# Patient Record
Sex: Male | Born: 1985 | Race: White | Hispanic: No | Marital: Single | Smoking: Former smoker
Health system: Southern US, Community
[De-identification: ages and names within clinical notes are randomized; demographics above are authoritative.]

## PROBLEM LIST (undated history)

## (undated) DIAGNOSIS — F1910 Other psychoactive substance abuse, uncomplicated: Secondary | ICD-10-CM

## (undated) DIAGNOSIS — E079 Disorder of thyroid, unspecified: Secondary | ICD-10-CM

## (undated) HISTORY — PX: HERNIA REPAIR: SHX51

---
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
## 2011-01-29 ENCOUNTER — Emergency Department (HOSPITAL_COMMUNITY): Payer: Self-pay

## 2011-01-29 ENCOUNTER — Emergency Department (HOSPITAL_COMMUNITY)
Admission: EM | Admit: 2011-01-29 | Discharge: 2011-01-29 | Disposition: A | Discharge status: Home / Self Care | Payer: No Typology Code available for payment source | Attending: Emergency Medicine | Admitting: Emergency Medicine

## 2011-01-29 DIAGNOSIS — R51 Headache: Secondary | ICD-10-CM | POA: Insufficient documentation

## 2011-01-29 DIAGNOSIS — M546 Pain in thoracic spine: Secondary | ICD-10-CM | POA: Insufficient documentation

## 2011-01-29 DIAGNOSIS — M542 Cervicalgia: Secondary | ICD-10-CM | POA: Insufficient documentation

## 2013-06-16 ENCOUNTER — Encounter (HOSPITAL_COMMUNITY): Payer: Self-pay | Admitting: Emergency Medicine

## 2013-06-16 ENCOUNTER — Emergency Department (HOSPITAL_COMMUNITY)
Admission: EM | Admit: 2013-06-16 | Discharge: 2013-06-16 | Disposition: A | Discharge status: Home / Self Care | Payer: Self-pay | Attending: Emergency Medicine | Admitting: Emergency Medicine

## 2013-06-16 DIAGNOSIS — R197 Diarrhea, unspecified: Secondary | ICD-10-CM | POA: Insufficient documentation

## 2013-06-16 DIAGNOSIS — F19939 Other psychoactive substance use, unspecified with withdrawal, unspecified: Secondary | ICD-10-CM | POA: Insufficient documentation

## 2013-06-16 DIAGNOSIS — Z880 Allergy status to penicillin: Secondary | ICD-10-CM | POA: Insufficient documentation

## 2013-06-16 DIAGNOSIS — R109 Unspecified abdominal pain: Secondary | ICD-10-CM | POA: Insufficient documentation

## 2013-06-16 DIAGNOSIS — F17200 Nicotine dependence, unspecified, uncomplicated: Secondary | ICD-10-CM | POA: Insufficient documentation

## 2013-06-16 DIAGNOSIS — F1110 Opioid abuse, uncomplicated: Secondary | ICD-10-CM | POA: Insufficient documentation

## 2013-06-16 LAB — RAPID URINE DRUG SCREEN, HOSP PERFORMED
Amphetamines: NOT DETECTED
Cocaine: POSITIVE — AB
Opiates: NOT DETECTED
Tetrahydrocannabinol: NOT DETECTED

## 2013-06-16 LAB — CBC
HCT: 42.8 % (ref 39.0–52.0)
Hemoglobin: 14.7 g/dL (ref 13.0–17.0)
RDW: 13.1 % (ref 11.5–15.5)
WBC: 8.6 10*3/uL (ref 4.0–10.5)

## 2013-06-16 LAB — COMPREHENSIVE METABOLIC PANEL
Albumin: 3.7 g/dL (ref 3.5–5.2)
BUN: 8 mg/dL (ref 6–23)
Chloride: 105 mEq/L (ref 96–112)
Creatinine, Ser: 0.95 mg/dL (ref 0.50–1.35)
GFR calc Af Amer: >90 mL/min (ref >90)
Glucose, Bld: 105 mg/dL — ABNORMAL HIGH (ref 70–99)
Total Bilirubin: 0.2 mg/dL — ABNORMAL LOW (ref 0.3–1.2)

## 2013-06-16 LAB — ETHANOL: Alcohol, Ethyl (B): <11 mg/dL (ref 0–11)

## 2013-06-16 NOTE — ED Notes (Signed)
Sarah at [facility] will call when labs have been reviewed by nurse.

## 2013-06-16 NOTE — ED Notes (Signed)
Spoke with Maralyn Sago at [facility] in [place]. Pt has a bed for detox after he has been medically cleared and labs/screening have been faxed to [phone].  Maralyn Sago can be reached at [phone].  The pt will have 2 hours from time of discharge to arrive at the facility.

## 2013-06-16 NOTE — ED Notes (Signed)
Pt DC per [facility], will drive self there per Maralyn Sago [facility].

## 2013-06-16 NOTE — ED Provider Notes (Signed)
CSN: [id number]     Arrival date & time 06/16/13  1205 History  This chart was scribed for Vida Roller, MD by Ardelia Mems, ED Scribe. This patient was seen in room APA15/APA15 and the patient's care was started at 1:45 PM.  Chief Complaint  Patient presents with  . V70.1    The history is provided by the patient. No language interpreter was used.    HPI Comments: Gevin Perea is a 27 y.o. male who presents to the Emergency Department requesting medical clearance in order to be cleared for [facility] in [facility] for a 5 day detox from Oxycodone. He states he has been doing pain pills since he was a teenager. He states that the detox is voluntary for him. He states that he snorts pills and does not inject. He states that he last used yesterday. He states that he has had cramping abdominal pain, diarrhea and lack of energy today which he attributes to withdrawals  He states that he uses cocaine occasionally, last being 3 days ago. He is a current every day smoker of 1 pack/day. He states that he drinks alcohol occasionally.   History reviewed. No pertinent past medical history. Past Surgical History  Procedure Laterality Date  . Hernia repair     No family history on file. History  Substance Use Topics  . Smoking status: Current Every Day Smoker -- 1.00 packs/day  . Smokeless tobacco: Not on file  . Alcohol Use: Not on file    Review of Systems  Gastrointestinal: Positive for abdominal pain and diarrhea.  All other systems reviewed and are negative.   Allergies  Penicillins  Home Medications  No current outpatient prescriptions on file.  There were no vitals taken for this visit.  Physical Exam  Nursing note and vitals reviewed. Constitutional: He is oriented to person, place, and time. He appears well-developed and well-nourished. No distress.  HENT:  Head: Normocephalic and atraumatic.  Eyes: EOM are normal.  Neck: Neck supple. No tracheal deviation present.   Cardiovascular: Normal rate.   Pulmonary/Chest: Effort normal. No respiratory distress.  Musculoskeletal: Normal range of motion.  Neurological: He is alert and oriented to person, place, and time.  Skin: Skin is warm and dry.  Psychiatric: He has a normal mood and affect. His behavior is normal.    ED Course  Procedures (including critical care time)  DIAGNOSTIC STUDIES:   COORDINATION OF CARE: 1:48 PM- Discussed plan to obtain diagnostic lab work. Pt advised of plan for treatment and pt agrees.  Labs Review Labs Reviewed  CBC  ETHANOL  URINE RAPID DRUG SCREEN (HOSP PERFORMED)  COMPREHENSIVE METABOLIC PANEL   Imaging Review No results found.  EKG Interpretation   None       MDM   1. Opiate abuse, continuous    The patient has a benign exam, he appears physically, medically and psychologically stable for followup, he requests that his paperwork be faxed to the treatment facility that he wants to go to, requests discharge when done, I will very happily facilitate this.   I personally performed the services described in this documentation, which was scribed in my presence. The recorded information has been reviewed and is accurate.      Vida Roller, MD 06/16/13 [phone]

## 2013-06-16 NOTE — ED Notes (Signed)
States he is here to be medically cleared for detox, addicted to pain pills

## 2014-02-16 ENCOUNTER — Emergency Department (HOSPITAL_COMMUNITY): Payer: No Typology Code available for payment source

## 2014-02-16 ENCOUNTER — Emergency Department (HOSPITAL_COMMUNITY)
Admission: EM | Admit: 2014-02-16 | Discharge: 2014-02-16 | Disposition: A | Discharge status: Home / Self Care | Payer: No Typology Code available for payment source | Attending: Emergency Medicine | Admitting: Emergency Medicine

## 2014-02-16 DIAGNOSIS — Y9389 Activity, other specified: Secondary | ICD-10-CM | POA: Insufficient documentation

## 2014-02-16 DIAGNOSIS — S20212A Contusion of left front wall of thorax, initial encounter: Secondary | ICD-10-CM

## 2014-02-16 DIAGNOSIS — F17200 Nicotine dependence, unspecified, uncomplicated: Secondary | ICD-10-CM | POA: Insufficient documentation

## 2014-02-16 DIAGNOSIS — S0093XA Contusion of unspecified part of head, initial encounter: Secondary | ICD-10-CM

## 2014-02-16 DIAGNOSIS — S39012A Strain of muscle, fascia and tendon of lower back, initial encounter: Secondary | ICD-10-CM

## 2014-02-16 DIAGNOSIS — S20219A Contusion of unspecified front wall of thorax, initial encounter: Secondary | ICD-10-CM | POA: Insufficient documentation

## 2014-02-16 DIAGNOSIS — S8001XA Contusion of right knee, initial encounter: Secondary | ICD-10-CM

## 2014-02-16 DIAGNOSIS — S60221A Contusion of right hand, initial encounter: Secondary | ICD-10-CM

## 2014-02-16 DIAGNOSIS — S0083XA Contusion of other part of head, initial encounter: Secondary | ICD-10-CM | POA: Insufficient documentation

## 2014-02-16 DIAGNOSIS — S239XXA Sprain of unspecified parts of thorax, initial encounter: Secondary | ICD-10-CM | POA: Insufficient documentation

## 2014-02-16 DIAGNOSIS — S0003XA Contusion of scalp, initial encounter: Secondary | ICD-10-CM | POA: Insufficient documentation

## 2014-02-16 DIAGNOSIS — S60229A Contusion of unspecified hand, initial encounter: Secondary | ICD-10-CM | POA: Insufficient documentation

## 2014-02-16 DIAGNOSIS — Z880 Allergy status to penicillin: Secondary | ICD-10-CM | POA: Insufficient documentation

## 2014-02-16 DIAGNOSIS — S1093XA Contusion of unspecified part of neck, initial encounter: Secondary | ICD-10-CM

## 2014-02-16 DIAGNOSIS — S8000XA Contusion of unspecified knee, initial encounter: Secondary | ICD-10-CM | POA: Insufficient documentation

## 2014-02-16 DIAGNOSIS — Y92410 Unspecified street and highway as the place of occurrence of the external cause: Secondary | ICD-10-CM | POA: Insufficient documentation

## 2014-02-16 DIAGNOSIS — S6000XA Contusion of unspecified finger without damage to nail, initial encounter: Secondary | ICD-10-CM

## 2014-02-16 MED ORDER — NAPROXEN 500 MG PO TABS: 500.0000 mg | ORAL_TABLET | Freq: Two times a day (BID) | ORAL | Status: DC

## 2014-02-16 MED ORDER — OXYCODONE-ACETAMINOPHEN 5-325 MG PO TABS: 1.0000 | ORAL_TABLET | Freq: Four times a day (QID) | ORAL | Status: DC | PRN

## 2014-02-16 MED ORDER — MORPHINE SULFATE 4 MG/ML IJ SOLN
4.0000 mg | Freq: Once | INTRAMUSCULAR | Status: AC
  Administered 2014-02-16: 4 mg via INTRAVENOUS
  Filled 2014-02-16: qty 1

## 2014-02-16 MED ORDER — METHOCARBAMOL 500 MG PO TABS: 500.0000 mg | ORAL_TABLET | Freq: Three times a day (TID) | ORAL | Status: DC | PRN

## 2014-02-16 NOTE — ED Notes (Signed)
Mercedes, PA at bedside. 

## 2014-02-16 NOTE — Discharge Instructions (Signed)
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  [employer] [facility] Dept. 1) [address] [place], [place] 2) [address] [place] 3)  [address], [place] [phone]  [phone]   [facility] Child Abuse Hotline 7408866806 or [phone] (After Hours)
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
## 2014-02-16 NOTE — ED Notes (Signed)
Family at bedside. 
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
## 2015-09-04 ENCOUNTER — Emergency Department (HOSPITAL_COMMUNITY)
Admission: EM | Admit: 2015-09-04 | Discharge: 2015-09-05 | Disposition: A | Discharge status: Other Acute Inpatient Hospital | Payer: Self-pay | Attending: Emergency Medicine | Admitting: Emergency Medicine

## 2015-09-04 ENCOUNTER — Encounter (HOSPITAL_COMMUNITY): Payer: Self-pay

## 2015-09-04 DIAGNOSIS — F17200 Nicotine dependence, unspecified, uncomplicated: Secondary | ICD-10-CM | POA: Insufficient documentation

## 2015-09-04 DIAGNOSIS — F1410 Cocaine abuse, uncomplicated: Secondary | ICD-10-CM | POA: Insufficient documentation

## 2015-09-04 DIAGNOSIS — Z880 Allergy status to penicillin: Secondary | ICD-10-CM | POA: Insufficient documentation

## 2015-09-04 DIAGNOSIS — R109 Unspecified abdominal pain: Secondary | ICD-10-CM | POA: Insufficient documentation

## 2015-09-04 DIAGNOSIS — R45851 Suicidal ideations: Secondary | ICD-10-CM | POA: Insufficient documentation

## 2015-09-04 DIAGNOSIS — G479 Sleep disorder, unspecified: Secondary | ICD-10-CM | POA: Insufficient documentation

## 2015-09-04 DIAGNOSIS — F419 Anxiety disorder, unspecified: Secondary | ICD-10-CM | POA: Insufficient documentation

## 2015-09-04 HISTORY — DX: Other psychoactive substance abuse, uncomplicated: F19.10

## 2015-09-04 LAB — CBC
HEMATOCRIT: 41.3 % (ref 39.0–52.0)
Hemoglobin: 14.1 g/dL (ref 13.0–17.0)
MCH: 30.9 pg (ref 26.0–34.0)
MCHC: 34.1 g/dL (ref 30.0–36.0)
MCV: 90.4 fL (ref 78.0–100.0)
PLATELETS: 258 10*3/uL (ref 150–400)
RBC: 4.57 MIL/uL (ref 4.22–5.81)
RDW: 12.7 % (ref 11.5–15.5)
WBC: 10.5 10*3/uL (ref 4.0–10.5)

## 2015-09-04 LAB — RAPID URINE DRUG SCREEN, HOSP PERFORMED
Amphetamines: NOT DETECTED
BARBITURATES: NOT DETECTED
BENZODIAZEPINES: NOT DETECTED
Cocaine: POSITIVE — AB
Opiates: NOT DETECTED
TETRAHYDROCANNABINOL: NOT DETECTED

## 2015-09-04 LAB — BASIC METABOLIC PANEL
ANION GAP: 7 (ref 5–15)
BUN: 8 mg/dL (ref 6–20)
CALCIUM: 9.3 mg/dL (ref 8.9–10.3)
CO2: 27 mmol/L (ref 22–32)
Chloride: 107 mmol/L (ref 101–111)
Creatinine, Ser: 0.87 mg/dL (ref 0.61–1.24)
Glucose, Bld: 91 mg/dL (ref 65–99)
Potassium: 3.6 mmol/L (ref 3.5–5.1)
SODIUM: 141 mmol/L (ref 135–145)

## 2015-09-04 LAB — ETHANOL

## 2015-09-04 MED ORDER — NICOTINE 14 MG/24HR TD PT24
MEDICATED_PATCH | TRANSDERMAL | Status: AC
  Administered 2015-09-04: 14 mg via TRANSDERMAL
  Filled 2015-09-04: qty 1

## 2015-09-04 MED ORDER — LORAZEPAM 1 MG PO TABS
1.0000 mg | ORAL_TABLET | Freq: Once | ORAL | Status: AC
  Administered 2015-09-04: 1 mg via ORAL
  Filled 2015-09-04: qty 1

## 2015-09-04 MED ORDER — NICOTINE 14 MG/24HR TD PT24
14.0000 mg | MEDICATED_PATCH | Freq: Once | TRANSDERMAL | Status: AC
  Administered 2015-09-04 (×2): 14 mg via TRANSDERMAL

## 2015-09-04 MED ORDER — ACETAMINOPHEN 325 MG PO TABS
650.0000 mg | ORAL_TABLET | ORAL | Status: DC | PRN
  Administered 2015-09-04 – 2015-09-05 (×3): 650 mg via ORAL
  Filled 2015-09-04 (×3): qty 2

## 2015-09-04 MED ORDER — LORAZEPAM 1 MG PO TABS
1.0000 mg | ORAL_TABLET | Freq: Three times a day (TID) | ORAL | Status: DC | PRN
  Administered 2015-09-04 – 2015-09-05 (×3): 1 mg via ORAL
  Filled 2015-09-04 (×3): qty 1

## 2015-09-04 NOTE — ED Notes (Signed)
Per Lelon MastSamantha with TTS pt is recommended for inpatient treatment.
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
## 2015-09-04 NOTE — Progress Notes (Signed)
Patient meets inpatient criteria, per NP Claudette Headonrad Withrow.  Patient has been [facility] for [facility]: [facility] per [facility]al [facility], adult bed may open tonight, beds open for everyone else. [facility] - per August Saucerean, adult beds available. [facility] - per [facility], low acuity and dual diagnosis bed open for adult male, fax it. [facility] - per intake, fax referral for review. [facility] - left voicemail   At capacity: [facility] - per Marylene Landngela  CSW will continue to seek placement.  [place] Abtsatia Dalynn Jhaveri, LCSWA Disposition staff 09/04/2015 6:17 PM

## 2015-09-05 ENCOUNTER — Inpatient Hospital Stay (HOSPITAL_COMMUNITY)
Admission: AD | Admit: 2015-09-05 | Discharge: 2015-09-09 | DRG: 885 | Disposition: A | Discharge status: Home / Self Care | Payer: No Typology Code available for payment source | Source: Intra-hospital | Attending: Psychiatry | Admitting: Psychiatry

## 2015-09-05 ENCOUNTER — Encounter (HOSPITAL_COMMUNITY): Payer: Self-pay

## 2015-09-05 DIAGNOSIS — F332 Major depressive disorder, recurrent severe without psychotic features: Secondary | ICD-10-CM | POA: Diagnosis not present

## 2015-09-05 DIAGNOSIS — F411 Generalized anxiety disorder: Secondary | ICD-10-CM | POA: Diagnosis not present

## 2015-09-05 DIAGNOSIS — F1120 Opioid dependence, uncomplicated: Secondary | ICD-10-CM | POA: Diagnosis not present

## 2015-09-05 DIAGNOSIS — F17210 Nicotine dependence, cigarettes, uncomplicated: Secondary | ICD-10-CM | POA: Diagnosis present

## 2015-09-05 DIAGNOSIS — F1910 Other psychoactive substance abuse, uncomplicated: Secondary | ICD-10-CM | POA: Diagnosis present

## 2015-09-05 DIAGNOSIS — F322 Major depressive disorder, single episode, severe without psychotic features: Secondary | ICD-10-CM | POA: Diagnosis present

## 2015-09-05 MED ORDER — ACETAMINOPHEN 325 MG PO TABS
650.0000 mg | ORAL_TABLET | Freq: Four times a day (QID) | ORAL | Status: DC | PRN
  Administered 2015-09-05 – 2015-09-09 (×2): 650 mg via ORAL
  Filled 2015-09-05 (×2): qty 2

## 2015-09-05 MED ORDER — MAGNESIUM HYDROXIDE 400 MG/5ML PO SUSP: 30.0000 mL | Freq: Every day | ORAL | Status: DC | PRN

## 2015-09-05 MED ORDER — ALUM & MAG HYDROXIDE-SIMETH 200-200-20 MG/5ML PO SUSP: 30.0000 mL | ORAL | Status: DC | PRN

## 2015-09-05 MED ORDER — HYDROXYZINE HCL 25 MG PO TABS
25.0000 mg | ORAL_TABLET | Freq: Three times a day (TID) | ORAL | Status: DC | PRN
  Administered 2015-09-05: 25 mg via ORAL
  Filled 2015-09-05: qty 1

## 2015-09-05 MED ORDER — TRAZODONE HCL 50 MG PO TABS
50.0000 mg | ORAL_TABLET | Freq: Every evening | ORAL | Status: DC | PRN
  Administered 2015-09-05: 50 mg via ORAL
  Filled 2015-09-05: qty 1

## 2015-09-05 NOTE — Progress Notes (Signed)
Adult Psychoeducational Group Note  Date:  09/05/2015 Time:  9:47 PM  Group Topic/Focus:  Wrap-Up Group:   The focus of this group is to help patients review their daily goal of treatment and discuss progress on daily workbooks.  Participation Level:  Did Not Attend  Participation Quality:  Did not attend  Affect:  Did not attend  Cognitive:  Did not attend  Insight: None  Engagement in Group:  Did not attend  Modes of Intervention:  Did not attend  Additional Comments:  Patient did not attend wrap up group tonight.   Oralia Criger L Alani Lacivita 09/05/2015, 9:47 PM

## 2015-09-05 NOTE — Progress Notes (Signed)
This is 30 yo male who came from [facility] with complain of increased depression and anxiety. Pt stated he broke up with fiance recently and moved to back to leave the mom and stressed about leaving the son behind. Pt is alert and oriented x 4, cooperative with admission process. Consents signed, skin/belongings search completed and pt oriented to unit. Pt stable at this time. Pt given the opportunity to express concerns and ask questions. Pt given toiletries. Will continue to monitor. Pt's safety ensured with 15 minute and environmental checks. Pt currently denies SI/HI and A/V hallucinations. Pt verbally agrees to seek staff if SI/HI or A/VH occurs and to consult with staff before acting on these thoughts. Will continue POC.

## 2015-09-05 NOTE — ED Provider Notes (Signed)
[facility] Will [facility] stable.   Samuel JesterKathleen Ahley Bulls, DO 09/05/15 1545

## 2015-09-05 NOTE — Progress Notes (Signed)
[facility] by Dr. Dub MikesLugo. Admission is voluntary and pt can arrive anytime. Number to call report is [phone].  Ilean SkillMeghan Mychelle Kendra, MSW, LCSW Clinical Social Work, Disposition  09/05/2015 [phone]

## 2015-09-05 NOTE — ED Notes (Signed)
[facility] here for transport, waiting on nurse to call back for report and pt. Will be transported.

## 2015-09-05 NOTE — Tx Team (Addendum)
Initial Interdisciplinary Treatment Plan   PATIENT STRESSORS: Financial difficulties Health problems Marital or family conflict Occupational concerns Substance abuse   PATIENT STRENGTHS: Ability for insight Communication skills Motivation for treatment/growth Supportive family/friends   PROBLEM LIST: Problem List/Patient Goals Date to be addressed Date deferred Reason deferred Estimated date of resolution  Substance abuse 09/05/15     Depression 09/05/15     " Get help with the substance abuse." 09/05/15     "Take care of my anxiety." 09/05/15     Risk for si                               DISCHARGE CRITERIA:  Ability to meet basic life and health needs Adequate post-discharge living arrangements Improved stabilization in mood, thinking, and/or behavior Medical problems require only outpatient monitoring Motivation to continue treatment in a less acute level of care Need for constant or close observation no longer present Reduction of life-threatening or endangering symptoms to within safe limits  PRELIMINARY DISCHARGE PLAN: Attend aftercare/continuing care group Attend PHP/IOP Attend 12-step recovery group Outpatient therapy  PATIENT/FAMIILY INVOLVEMENT: This treatment plan has been presented to and reviewed with the patient, Alfred Perez, and/or family member.  The patient and family have been given the opportunity to ask questions and make suggestions.  Alfred Perez 09/05/2015, 6:47 PM

## 2015-09-05 NOTE — Progress Notes (Signed)
Followed up with inpatient psychiatric referrals. [facility].  Under review-  [facility]- per [facility]- [facility]  Other hospitals contacted/pt referred to yesterday are at capacity currently.  Ilean SkillMeghan Curties Conigliaro, MSW, LCSW Clinical Social Work, Disposition  09/05/2015 [phone]

## 2015-09-05 NOTE — Progress Notes (Addendum)
D: Alfred Perez has awakened after being asleep in his room since the early evening. He is complaining of anxiety and R sided lower abdominal pain as well as leg cramps. States last BM 2-3 days ago which is normal for him. Abdominal pain has been present for past 4-5 days. Denies nausea/vomiting. No visible tremors or diaphoresis.  He states he feels he may be having withdrawals as he is also experiencing leg cramps. He admits to overusing cocaine, oxycodone and aderall on Tuesday night. Rates Anxiety 9/10 Depressopm 9/10 and sadness 8/10. Also endorsing insomnia. On call provider notified of anxiety and insomnia. Denies SI/HI/AVH. Endorsing racing thoughts and stress with not being able to see his children.  A: Q 15 minute checks for patient safety. Encouragement and support given.  Medications administered as prescribed.   R: Continue to monitor for patient safety and medication effectiveness.

## 2015-09-06 ENCOUNTER — Encounter (HOSPITAL_COMMUNITY): Payer: Self-pay | Admitting: Psychiatry

## 2015-09-06 DIAGNOSIS — F411 Generalized anxiety disorder: Secondary | ICD-10-CM | POA: Diagnosis present

## 2015-09-06 DIAGNOSIS — F1120 Opioid dependence, uncomplicated: Secondary | ICD-10-CM

## 2015-09-06 DIAGNOSIS — F332 Major depressive disorder, recurrent severe without psychotic features: Secondary | ICD-10-CM | POA: Diagnosis present

## 2015-09-06 MED ORDER — ONDANSETRON 4 MG PO TBDP: 4.0000 mg | ORAL_TABLET | Freq: Three times a day (TID) | ORAL | Status: DC | PRN

## 2015-09-06 MED ORDER — NICOTINE POLACRILEX 2 MG MT GUM
2.0000 mg | CHEWING_GUM | OROMUCOSAL | Status: DC | PRN
  Administered 2015-09-06 – 2015-09-09 (×7): 2 mg via ORAL
  Filled 2015-09-06 (×6): qty 1

## 2015-09-06 MED ORDER — TRAZODONE HCL 100 MG PO TABS
100.0000 mg | ORAL_TABLET | Freq: Every evening | ORAL | Status: DC | PRN
  Administered 2015-09-06 – 2015-09-08 (×4): 100 mg via ORAL
  Filled 2015-09-06 (×3): qty 1
  Filled 2015-09-06: qty 14
  Filled 2015-09-06: qty 1

## 2015-09-06 MED ORDER — DULOXETINE HCL 20 MG PO CPEP
20.0000 mg | ORAL_CAPSULE | Freq: Every day | ORAL | Status: DC
  Administered 2015-09-06 – 2015-09-09 (×4): 20 mg via ORAL
  Filled 2015-09-06 (×6): qty 1

## 2015-09-06 MED ORDER — LORAZEPAM 1 MG PO TABS
1.0000 mg | ORAL_TABLET | Freq: Four times a day (QID) | ORAL | Status: DC | PRN
  Administered 2015-09-06 – 2015-09-09 (×5): 1 mg via ORAL
  Filled 2015-09-06 (×5): qty 1

## 2015-09-06 MED ORDER — HYDROXYZINE HCL 25 MG PO TABS
25.0000 mg | ORAL_TABLET | ORAL | Status: DC | PRN
  Administered 2015-09-06 – 2015-09-08 (×5): 25 mg via ORAL
  Filled 2015-09-06: qty 10
  Filled 2015-09-06 (×5): qty 1

## 2015-09-06 MED ORDER — GABAPENTIN 100 MG PO CAPS
100.0000 mg | ORAL_CAPSULE | Freq: Three times a day (TID) | ORAL | Status: DC
  Administered 2015-09-06 – 2015-09-07 (×3): 100 mg via ORAL
  Filled 2015-09-06 (×9): qty 1

## 2015-09-06 MED ORDER — LOPERAMIDE HCL 2 MG PO CAPS: 2.0000 mg | ORAL_CAPSULE | ORAL | Status: DC | PRN

## 2015-09-06 MED ORDER — LORAZEPAM 1 MG PO TABS: 1.0000 mg | ORAL_TABLET | Freq: Four times a day (QID) | ORAL | Status: DC | PRN

## 2015-09-06 NOTE — Tx Team (Addendum)
Interdisciplinary Treatment Plan Update (Adult) Date: 09/06/2015   Date: 09/06/2015 8:31 AM  Progress in Treatment:  Attending groups: Yes Participating in groups: Yes Taking medication as prescribed: Yes  Tolerating medication: Yes  Family/Significant other contact made: No, CSW assessing for appropriate contact Patient understands diagnosis: Yes AEB seeking help with depression Discussing patient identified problems/goals with staff: Yes  Medical problems stabilized or resolved: Yes  Denies suicidal/homicidal ideation: Yes Patient has not harmed self or Others: Yes   New problem(s) identified: None identified at this time.   Discharge Plan or Barriers: Patient plans to return home to follow up with outpatient services at [facility]  Additional comments:  Patient and CSW reviewed pt's identified goals and treatment plan. Patient verbalized understanding and agreed to treatment plan. CSW reviewed [facility] "Discharge Process and Patient Involvement" Form. Pt verbalized understanding of information provided and signed form.   Reason for Continuation of Hospitalization:  Anxiety Depression Medication stabilization Suicidal ideation Withdrawal symptoms  Estimated length of stay: Discharge anticipated for 09/09/15  Review of initial/current patient goals per problem list:   1.  Goal(s): Patient will participate in aftercare plan  Met:  Yes  Target date: 3-5 days from date of admission   As evidenced by: Patient will participate within aftercare plan AEB aftercare provider and housing plan at discharge being identified.  09/06/15: CSW to work with Pt to assess for appropriate discharge plan and faciliate appointments and referrals as needed prior to d/c. 1/16: Goal met. Patient plans to return home with outpatient services.   2.  Goal (s): Patient will exhibit decreased depressive symptoms and suicidal ideations.  Met:  Yes  Target date: 3-5 days from date of admission   As  evidenced by: Patient will utilize self rating of depression at 3 or below and demonstrate decreased signs of depression or be deemed stable for discharge by MD. 09/06/15: Pt was admitted with symptoms of depression, rating 10/10. Pt continues to present with flat affect and depressive symptoms.  Pt will demonstrate decreased symptoms of depression and rate depression at 3/10 or lower prior to discharge. 1/16: Goal met. Rates depression at 0 today, denies SI.  3.  Goal(s): Patient will demonstrate decreased signs and symptoms of anxiety.  Met:  Yes  Target date: 3-5 days from date of admission   As evidenced by: Patient will utilize self rating of anxiety at 3 or below and demonstrated decreased signs of anxiety, or be deemed stable for discharge by MD 09/06/15: Pt was admitted with increased levels of anxiety and is currently rating those symptoms highly. Pt will demonstrated decreased symptoms of anxiety and rate it at 3/10 prior to d/c. 1/16: Goal met. Patient reports low levels of anxiety today.  4.  Goal(s): Patient will demonstrate decreased signs of withdrawal due to substance abuse  Met: Adequate for discharge per MD.  Target date: 3-5 days from date of admission   As evidenced by: Patient will produce a CIWA/COWS score of 0, have stable vitals signs, and no symptoms of withdrawal  09/06/15: Pt has COWS score of 5. Pt endorses leg cramps, GI upset, runny nose, and anxiety as symptoms of withdrawal.  1/16: Adequate for discharge per MD. Patient with CIWA score of 1, COWS score of 2 experiencing resting pulse rate and anxiety but he reports feeling safe for discharge.  Attendees:  Patient:    Family:    Physician: Dr. Parke Poisson, MD  09/06/2015 8:31 AM  Nursing: Lars Pinks, RN Case manager  09/06/2015 8:31 AM  Clinical Social Worker Peri Maris, [place] 09/06/2015 8:31 AM  Other: Tilden Fossa, LCSWA 09/06/2015 8:31 AM  Clinical: Sandre Kitty RN 09/06/2015 8:31 AM  Other: , RN  Charge Nurse 09/06/2015 8:31 AM  Other: Hilda Lias, [place], [place] Social Work [phone]
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
## 2015-09-06 NOTE — Progress Notes (Signed)
Patient did attend the last half of the evening speaker AA meeting. Pt was talking with his nurse in the hall for the first part of the meeting.

## 2015-09-06 NOTE — [facility] Group Notes (Signed)
[facility] LCSW Group Therapy 09/06/2015 1:15pm  Type of Therapy: Group Therapy- Feelings Around Relapse and Recovery  Pt did not attend, declined invitation.    Chad CordialLauren Carter, Theresia MajorsLCSWA [phone] 09/06/2015 4:45 PM

## 2015-09-06 NOTE — Plan of Care (Signed)
Problem: Ineffective individual coping Goal: STG: Patient will remain free from self harm Outcome: Progressing Pt safe on the unit at this time     
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
## 2015-09-06 NOTE — [facility] Group Notes (Signed)
[facility]  09/06/2015 8:45 AM  Pt did not attend, declined invitation.   Chad CordialLauren Carter, LCSWA 09/06/2015 4:45 PM

## 2015-09-06 NOTE — [facility] Suicide Risk Assessment (Signed)
[facility]   Nursing information obtained from:    Demographic factors:    Current Mental Status:    Loss Factors:    Historical Factors:    Risk Reduction Factors:    Total Time spent with patient: 45 minutes Principal Problem: Severe recurrent major depression without psychotic features (HCC) Diagnosis:   Patient Active Problem List   Diagnosis Date Noted  . Opioid type dependence, continuous (HCC) [F11.20] 09/06/2015  . Severe recurrent major depression without psychotic features (HCC) [F33.2] 09/06/2015  . GAD (generalized anxiety disorder) [F41.1] 09/06/2015  . Substance abuse [F19.10] 09/05/2015     Continued Clinical Symptoms:  Alcohol Use Disorder Identification Test Final Score (AUDIT): 0 The "Alcohol Use Disorders Identification Test", Guidelines for Use in Primary Care, Second Edition.  World Science writerHealth Organization [facility](WHO). Score between 0-7:  no or low risk or alcohol related problems. Score between 8-15:  moderate risk of alcohol related problems. Score between 16-19:  high risk of alcohol related problems. Score 20 or above:  warrants further diagnostic evaluation for alcohol dependence and treatment.   CLINICAL FACTORS:   Depression:   Comorbid alcohol abuse/dependence Alcohol/Substance Abuse/Dependencies   Psychiatric Specialty Exam: Physical Exam  ROS  Blood pressure 120/70, pulse 71, temperature 99.3 F (37.4 C), temperature source Oral, resp. rate 18, height 5\' 11"  (1.803 m), weight 68.493 kg (151 lb), SpO2 100 %.Body mass index is 21.07 kg/(m^2).   COGNITIVE FEATURES THAT CONTRIBUTE TO RISK:  Closed-mindedness, Polarized thinking and Thought constriction (tunnel vision)    SUICIDE RISK:   Moderate:  Frequent suicidal ideation with limited intensity, and duration, some specificity in terms of plans, no associated intent, good self-control, limited dysphoria/symptomatology, some risk factors present, and identifiable protective  factors, including available and accessible social support.  PLAN OF CARE: see admission H and P  Medical Decision Making:  Review of Psycho-Social Stressors (1), Review or order clinical lab tests (1), Review of Medication Regimen & Side Effects (2) and Review of New Medication or Change in Dosage (2)  I certify that inpatient services furnished can reasonably be expected to improve the patient's condition.   Geza Beranek A 09/06/2015, 5:44 PM

## 2015-09-06 NOTE — Progress Notes (Signed)
D: Pt denies SI/HI/AVH. Pt is pleasant and cooperative. Pt stated he was having issues with his ex-fiance and issue being able to see his kids. "trying to straighten out crinkles in life. Pt seen active on the milieu.   A: Pt was offered support and encouragement. Pt was given scheduled medications. Pt was encourage to attend groups. Q 15 minute checks were done for safety.   R:Pt attends groups and interacts well with peers and staff. Pt is taking medication. Pt has no complaints.Pt receptive to treatment and safety maintained on unit.

## 2015-09-06 NOTE — Progress Notes (Signed)
Recreation Therapy Notes  Date: 01.13.2017  Time: 9:30am Location: 300 Group Room   Group Topic: Stress Management  Goal Area(s) Addresses:  Patient will actively participate in stress management techniques presented during session.   Behavioral Response: Did not attend.   Javontay Vandam L Etienne Millward, LRT/CTRS        Becky Colan L 09/06/2015 4:04 PM 

## 2015-09-06 NOTE — Progress Notes (Signed)
D) Pt has been attending the groups. Presents with increased anxiety.States "i feel so very anxious I need both my ativan and my vistaril now. Pt denies SI and HI. Rates his depression at a 9, his hopelessness at a 7 and his anxiety at a 10. Affect and mood are both anxious. A) Pt given the prn medications requested. Provided with a brief 1:1. Given support, reassurance and praise along with encouragement. R) Pt denies SI and HI. Plans to continue to attend groups and to decrease his anxiety.
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
## 2015-09-07 MED ORDER — GABAPENTIN 100 MG PO CAPS
100.0000 mg | ORAL_CAPSULE | Freq: Three times a day (TID) | ORAL | Status: DC
  Administered 2015-09-07 – 2015-09-09 (×6): 100 mg via ORAL
  Filled 2015-09-07: qty 21
  Filled 2015-09-07 (×9): qty 1
  Filled 2015-09-07 (×2): qty 21

## 2015-09-07 NOTE — Progress Notes (Signed)
[facility]  09/07/2015 4:28 PM Tora DuckJason Perez  MRN:  [id number]  Subjective: Barbara CowerJason reports, "I have bad depression & anxiety. I guess I'm feeling fair today, still have a long way to go. I feel tired, my whole body is aching. I could not go to the morning group meeting because I'm not feeling my best. Trying to shower, then I may feel better".  Principal Problem: Severe recurrent major depression without psychotic features (HCC)  Diagnosis:   Patient Active Problem List   Diagnosis Date Noted  . Opioid type dependence, continuous (HCC) [F11.20] 09/06/2015  . Severe recurrent major depression without psychotic features (HCC) [F33.2] 09/06/2015  . GAD (generalized anxiety disorder) [F41.1] 09/06/2015  . Substance abuse [F19.10] 09/05/2015   Total Time spent with patient: 20 minutes  Past Psychiatric History: See H&P  Past Medical History:  Past Medical History  Diagnosis Date  . Polysubstance abuse     Past Surgical History  Procedure Laterality Date  . Hernia repair     Family History: History reviewed. No pertinent family history.  Family Psychiatric  History: see H&P  Social History:  History  Alcohol Use No     History  Drug Use  . Yes  . Special: Methamphetamines, Marijuana, Cocaine    Comment: opiates    Social History   Social History  . Marital Status: Single    Spouse Name: N/A  . Number of Children: N/A  . Years of Education: N/A   Social History Main Topics  . Smoking status: Current Every Day Smoker -- 1.00 packs/day  . Smokeless tobacco: None  . Alcohol Use: No  . Drug Use: Yes    Special: Methamphetamines, Marijuana, Cocaine     Comment: opiates  . Sexual Activity: Not Asked   Other Topics Concern  . None   Social History Narrative   Additional Social History:    Pain Medications: see MAR Prescriptions: see MAR Over the Counter: see MAR History of alcohol / drug use?: Yes Longest period of sobriety (when/how long): 3 years when  incarcerated from 2011-2014 Negative Consequences of Use: Personal relationships, Legal Withdrawal Symptoms: Tremors, Irritability Name of Substance 1: Cocaine 1 - Age of First Use: 12 1 - Amount (size/oz): varies 1 - Frequency: daily 1 - Duration: ongoing 1 - Last Use / Amount: yesterday Name of Substance 2: Opiates and Amphetamines 2 - Age of First Use: 12 2 - Amount (size/oz): varies 2 - Frequency: several times a week 2 - Duration: ongoing 2 - Last Use / Amount: 4 days ago  Sleep: Fair  Appetite:  Fair  Current Medications: Current Facility-Administered Medications  Medication Dose Route Frequency Provider Last Rate Last Dose  . acetaminophen (TYLENOL) tablet 650 mg  650 mg Oral Q6H PRN Adonis BrookSheila Agustin, NP   650 mg at 09/05/15 2318  . alum & mag hydroxide-simeth (MAALOX/MYLANTA) 200-200-20 MG/5ML suspension 30 mL  30 mL Oral Q4H PRN Adonis BrookSheila Agustin, NP      . DULoxetine (CYMBALTA) DR capsule 20 mg  20 mg Oral Daily Rachael FeeIrving A Lugo, MD   20 mg at 09/07/15 0840  . gabapentin (NEURONTIN) capsule 100 mg  100 mg Oral TID Sanjuana KavaAgnes I Nwoko, NP      . hydrOXYzine (ATARAX/VISTARIL) tablet 25 mg  25 mg Oral Q4H PRN Rachael FeeIrving A Lugo, MD   25 mg at 09/07/15 1308  . loperamide (IMODIUM) capsule 2 mg  2 mg Oral PRN Rachael FeeIrving A Lugo, MD      .  LORazepam (ATIVAN) tablet 1 mg  1 mg Oral Q6H PRN Rachael Fee, MD   1 mg at 09/06/15 2135  . magnesium hydroxide (MILK OF MAGNESIA) suspension 30 mL  30 mL Oral Daily PRN Adonis Brook, NP      . nicotine polacrilex (NICORETTE) gum 2 mg  2 mg Oral PRN Rachael Fee, MD   2 mg at 09/07/15 1308  . ondansetron (ZOFRAN-ODT) disintegrating tablet 4 mg  4 mg Oral Q8H PRN Rachael Fee, MD      . traZODone (DESYREL) tablet 100 mg  100 mg Oral QHS PRN Rachael Fee, MD   100 mg at 09/06/15 2135    Lab Results: No results found for this or any previous visit (from the past 48 hour(s)).  Physical Findings: AIMS: Facial and Oral Movements Muscles of Facial Expression:  None, normal Lips and Perioral Area: None, normal Jaw: None, normal Tongue: None, normal,Extremity Movements Upper (arms, wrists, hands, fingers): None, normal Lower (legs, knees, ankles, toes): None, normal, Trunk Movements Neck, shoulders, hips: None, normal, Overall Severity Severity of abnormal movements (highest score from questions above): None, normal Incapacitation due to abnormal movements: None, normal Patient's awareness of abnormal movements (rate only patient's report): No Awareness, Dental Status Current problems with teeth and/or dentures?: No Does patient usually wear dentures?: No  CIWA:  CIWA-Ar Total: 4 COWS:  COWS Total Score: 5  Musculoskeletal: Strength & Muscle Tone: within normal limits Gait & Station: normal Patient leans: N/A  Psychiatric Specialty Exam: ROS  Blood pressure 101/60, pulse 111, temperature 97.5 F (36.4 C), temperature source Oral, resp. rate 16, height 5\' 11"  (1.803 m), weight 68.493 kg (151 lb), SpO2 100 %.Body mass index is 21.07 kg/(m^2).  General Appearance: Fairly Groomed  Patent attorney:: Fair  Speech: Clear and Coherent  Volume: fluctuates  Mood: Anxious, Depressed and Dysphoric  Affect: Restricted  Thought Process: Coherent and Goal Directed  Orientation: Full (Time, Place, and Person)  Thought Content: symptoms events worries concerns  Suicidal Thoughts: Not right now  Homicidal Thoughts: No  Memory: Immediate; Fair Recent; Fair Remote; Fair  Judgement: Fair  Insight: Present and Shallow  Psychomotor Activity: Restlessness  Concentration: Fair  Recall: [employer] of Knowledge:Fair  Language: Fair  Akathisia: No  Handed: Right  AIMS (if indicated):    Assets: Desire for Improvement  ADL's: Intact  Cognition: WNL  Sleep: Number of Hours: 6.5         Treatment Plan Summary: 1. Continue crisis management, mood stabilization & relapse prevention.. 2. Continue  current medication management to reduce current symptoms to base line and improve the  patient's overall level of functioning; Duloxetine 20 mg for depression, Gabapentin 100 mg tid for agitation, Lorazepam 1 mg for anxiety, & Trazodone 100 mg for insomnia 3. Treat health problems as indicated. 4. Develop treatment plan to enhance medication adeherance upon discharge and prevent the need for  readmission. 5. Psycho-social education regarding relapse prevention and self care. 6. Social worker to work on discharge plans.  Armandina Stammer I, PMHNP, FNP-BC 09/07/2015, 4:28 PM

## 2015-09-07 NOTE — Progress Notes (Signed)
D: Pt visible in milieu at intervals this afternoon. Presents with congruent affect and mood. Observed interacting well with peers in dayroom at present. Refused to attend scheduled AM groups despite staff encouragement and prompting. Reported his depression 8/10, hopelessness 6/10 and anxiety 8/10 on self inventory sheet. Pt's goal for today is "getting better". Denied SI, HI, pain and AVH when assessed earlier this shift at time of medication administration.  A: Support, availability and ecouragement offered to pt throughout this shift. All medications administered as per order. Q 15 minutes checks maintained for safety without issues to note at this time.  R: Pt remains safe on and off unit. Reported relief from anxiety when reassessed post PRN medication administration. Denies adverse drug reactions when assessed.

## 2015-09-07 NOTE — [facility] Group Notes (Signed)
[facility] Group Notes: (Clinical Social Work)   09/07/2015      Type of Therapy:  Group Therapy   Participation Level:  Did Not Attend despite MHT prompting   Michio Thier Grossman-Orr, LCSW 09/07/2015, 11:16 AM     

## 2015-09-07 NOTE — [facility] Group Notes (Signed)
[facility] Group Notes:  (Nursing/Healthy Coping Skills)  Date:  09/07/2015  Time:  0930 Type of Therapy:  Nurse Education  Participation Level:  Did Not Attend  Summary of Progress/Problems:  Pt did not attend scheduled group despite prompts. Verbal encouragement provided to pt related to compliance with treatment including group attendance.     Sherryl MangesWesseh, Monaye Blackie 09/07/2015,0930

## 2015-09-07 NOTE — Plan of Care (Signed)
Problem: Alteration in mood Goal: STG-Patient reports thoughts of self-harm to staff Outcome: Progressing Pt denied SI, verbally contracted for safety when assessed. Medicated for c/o anxiety as per current orders.

## 2015-09-07 NOTE — Plan of Care (Signed)
Problem: Alteration in mood & ability to function due to Goal: STG-Patient will attend groups Outcome: Not Progressing Pt did not attend scheduled groups on unit this shift despite prompts. Encouragement provided towards compliance with groups and treatment plan.

## 2015-09-07 NOTE — Plan of Care (Signed)
Problem: Ineffective individual coping Goal: STG: Patient will remain free from self harm Outcome: Progressing Pt safe on the unit at this time     

## 2015-09-07 NOTE — Progress Notes (Signed)
Adult Psychoeducational Group Note  Date:  09/07/2015 Time:  8:49 PM  Group Topic/Focus:  Wrap-Up Group:   The focus of this group is to help patients review their daily goal of treatment and discuss progress on daily workbooks.  Participation Level:  Active  Participation Quality:  Appropriate and Attentive  Affect:  Appropriate  Cognitive:  Appropriate  Insight: Appropriate  Engagement in Group:  Engaged  Modes of Intervention:  Discussion  Additional Comments:  Pt stated he had a long day, with lots of anxiety. Pt stated he had a good visit with his mom and step dad. Pt stated he wants to get work on getting into an outpatient treatment center, and setting up doctors and therapists when he leaves. Pt states he wants to work on living for today.  Caswell CorwinOwen, Andersson Larrabee C 09/07/2015, 8:49 PM

## 2015-09-08 NOTE — [facility] Group Notes (Signed)
[facility] Group Notes:  (Nursing/MHT/Case Management/Adjunct)  Date:  09/08/2015  Time:  1400  Type of Therapy:  Nurse Education - Healthy Support Systems  Participation Level:  Active  Participation Quality:  Attentive  Affect:  Appropriate  Cognitive:  Alert  Insight:  Improving  Engagement in Group:  Engaged  Modes of Intervention:  Education  Summary of Progress/Problems:  Patient attended and participated appropriately in nursing education group.  Merian CapronFriedman, Meaghann Choo [facility] 09/08/2015, 3:04 PM

## 2015-09-08 NOTE — Progress Notes (Signed)
Patient has denied SI, HI and AVH.  Patient reported always feeling anxious.  Patient stated "I can't help thinking about things that happened in my past and where I should be in life now."  Patient reported that his thoughts are "Loud" and they are always racing.   Assess patient for safety, offer medications as prescribed, engage writer in 1:1 staff talks,   Patient was able to contract for safety.

## 2015-09-08 NOTE — Progress Notes (Signed)
Assumed care of patient at 2330.  Pt is currently resting in room with eyes closed.  Respirations are even and unlabored.  Pt does not appear to be in any distress.  Will continue to monitor and assess.  

## 2015-09-08 NOTE — Progress Notes (Signed)
Patient ID: Alfred DuckJason Perez, male   DOB: 1986/02/23, 30 y.o.   MRN: [id number] [facility] Note  09/08/2015 3:19 PM Alfred DuckJason Perez  MRN:  [id number]  Subjective: Alfred CowerJason reports, "I feel better today because my withdrawal symptoms are better. I feel like the Cymbalta is working for me. My anxiety continues to bother me, I'm trying to deal with it. I hope to have an outpatient clinic to go to for my medicines after discharge. I will be moving in with my sister after I leave this hospital. I will need a referral to a [facility] for Suboxen treatment for my opioid addiction. I have 2 of my friends on Suboxen therapy. They are doing very well. No cravings what so ever".  Principal Problem: Severe recurrent major depression without psychotic features (HCC)  Diagnosis:   Patient Active Problem List   Diagnosis Date Noted  . Opioid type dependence, continuous (HCC) [F11.20] 09/06/2015  . Severe recurrent major depression without psychotic features (HCC) [F33.2] 09/06/2015  . GAD (generalized anxiety disorder) [F41.1] 09/06/2015  . Substance abuse [F19.10] 09/05/2015   Total Time spent with patient: 15 minutes  Past Psychiatric History: See H&P  Past Medical History:  Past Medical History  Diagnosis Date  . Polysubstance abuse     Past Surgical History  Procedure Laterality Date  . Hernia repair     Family History: History reviewed. No pertinent family history.  Family Psychiatric  History: see H&P  Social History:  History  Alcohol Use No     History  Drug Use  . Yes  . Special: Methamphetamines, Marijuana, Cocaine    Comment: opiates    Social History   Social History  . Marital Status: Single    Spouse Name: N/A  . Number of Children: N/A  . Years of Education: N/A   Social History Main Topics  . Smoking status: Current Every Day Smoker -- 1.00 packs/day  . Smokeless tobacco: None  . Alcohol Use: No  . Drug Use: Yes    Special: Methamphetamines, Marijuana, Cocaine      Comment: opiates  . Sexual Activity: Not Asked   Other Topics Concern  . None   Social History Narrative   Additional Social History:  Pain Medications: see MAR Prescriptions: see MAR Over the Counter: see MAR History of alcohol / drug use?: Yes Longest period of sobriety (when/how long): 3 years when incarcerated from 2011-2014 Negative Consequences of Use: Personal relationships, Legal Withdrawal Symptoms: Tremors, Irritability Name of Substance 1: Cocaine 1 - Age of First Use: 12 1 - Amount (size/oz): varies 1 - Frequency: daily 1 - Duration: ongoing 1 - Last Use / Amount: yesterday Name of Substance 2: Opiates and Amphetamines 2 - Age of First Use: 12 2 - Amount (size/oz): varies 2 - Frequency: several times a week 2 - Duration: ongoing 2 - Last Use / Amount: 4 days ago  Sleep: Good  Appetite:  Good  Current Medications: Current Facility-Administered Medications  Medication Dose Route Frequency Provider Last Rate Last Dose  . acetaminophen (TYLENOL) tablet 650 mg  650 mg Oral Q6H PRN Adonis BrookSheila Agustin, NP   650 mg at 09/05/15 2318  . alum & mag hydroxide-simeth (MAALOX/MYLANTA) 200-200-20 MG/5ML suspension 30 mL  30 mL Oral Q4H PRN Adonis BrookSheila Agustin, NP      . DULoxetine (CYMBALTA) DR capsule 20 mg  20 mg Oral Daily Rachael FeeIrving A Lugo, MD   20 mg at 09/08/15 0834  . gabapentin (NEURONTIN) capsule 100 mg  100 mg Oral TID Sanjuana Kava, NP   100 mg at 09/08/15 1116  . hydrOXYzine (ATARAX/VISTARIL) tablet 25 mg  25 mg Oral Q4H PRN Rachael Fee, MD   25 mg at 09/08/15 1116  . loperamide (IMODIUM) capsule 2 mg  2 mg Oral PRN Rachael Fee, MD      . LORazepam (ATIVAN) tablet 1 mg  1 mg Oral Q6H PRN Rachael Fee, MD   1 mg at 09/07/15 2112  . magnesium hydroxide (MILK OF MAGNESIA) suspension 30 mL  30 mL Oral Daily PRN Adonis Brook, NP      . nicotine polacrilex (NICORETTE) gum 2 mg  2 mg Oral PRN Rachael Fee, MD   2 mg at 09/08/15 0835  . ondansetron (ZOFRAN-ODT)  disintegrating tablet 4 mg  4 mg Oral Q8H PRN Rachael Fee, MD      . traZODone (DESYREL) tablet 100 mg  100 mg Oral QHS PRN Rachael Fee, MD   100 mg at 09/07/15 2112    Lab Results: No results found for this or any previous visit (from the past 48 hour(s)).  Physical Findings: AIMS: Facial and Oral Movements Muscles of Facial Expression: None, normal Lips and Perioral Area: None, normal Jaw: None, normal Tongue: None, normal,Extremity Movements Upper (arms, wrists, hands, fingers): None, normal Lower (legs, knees, ankles, toes): None, normal, Trunk Movements Neck, shoulders, hips: None, normal, Overall Severity Severity of abnormal movements (highest score from questions above): None, normal Incapacitation due to abnormal movements: None, normal Patient's awareness of abnormal movements (rate only patient's report): No Awareness, Dental Status Current problems with teeth and/or dentures?: No Does patient usually wear dentures?: No  CIWA:  CIWA-Ar Total: 4 COWS:  COWS Total Score: 5  Musculoskeletal: Strength & Muscle Tone: within normal limits Gait & Station: normal Patient leans: N/A  Psychiatric Specialty Exam: ROS  Blood pressure 91/59, pulse 97, temperature 97.8 F (36.6 C), temperature source Oral, resp. rate 16, height 5\' 11"  (1.803 m), weight 68.493 kg (151 lb), SpO2 100 %.Body mass index is 21.07 kg/(m^2).  General Appearance: Fairly Groomed  Patent attorney:: Fair  Speech: Clear and Coherent  Volume: fluctuates  Mood:"Improving"  Affect:Appropriate  Thought Process: Coherent and Goal Directed  Orientation: Full (Time, Place, and Person)  Thought Content:Denies any hallucinations, delusions or paranoia  Suicidal Thoughts: Not right now  Homicidal Thoughts: No  Memory: Immediate; Fair Recent; Fair Remote; Fair  Judgement: Fair  Insight: Present and Shallow  Psychomotor Activity: Normal  Concentration: Fair  Recall: [employer] of Knowledge:Fair  Language: Fair  Akathisia: No  Handed: Right  AIMS (if indicated):    Assets: Desire for Improvement  ADL's: Intact  Cognition: WNL  Sleep: Number of Hours: 6.5         Treatment Plan Summary: 1. Continue crisis management, mood stabilization & relapse prevention.. 2. Continue current medication management to reduce current symptoms to base line and improve the  patient's overall level of functioning; Duloxetine 20 mg for depression, Gabapentin 100 mg tid for agitation, Lorazepam 1 mg for anxiety, & Trazodone 100 mg for insomnia 3. Treat health problems as indicated. 4. Develop treatment plan to enhance medication adeherance upon discharge and prevent the need for  readmission. 5. Psycho-social education regarding relapse prevention and self care. 6. Social worker to work on discharge plans. 7. Patient requested a referral to a [facility] for [facility] therapy.  Armandina Stammer I, PMHNP, FNP-BC 09/08/2015, 3:19 PM

## 2015-09-08 NOTE — Progress Notes (Signed)
D- Patient appears anxious this shift.  Denies SI, HI, AVH, and pain.  Patient reports "racing thoughts" at night while laying in his bed.  Patient was observed in the dayroom interacting well with peers.  Patient had to be reinformed of the rules regarding entering other patient's room when found in peers room.  Patient agreed to abide by the rules.  No other issues reported. A- Scheduled and PRN medications administered to patient, per MD orders. Support and encouragement provided.  Routine safety checks conducted every 15 minutes.  Patient informed to notify staff with problems or concerns. R- No adverse drug reactions noted. Patient contracts for safety at this time. Patient compliant with medications and treatment plan. Patient receptive, calm, and cooperative.  Patient remains safe at this time.

## 2015-09-08 NOTE — Progress Notes (Signed)
Patient did attend the evening speaker AA meeting.  

## 2015-09-08 NOTE — [facility] Group Notes (Signed)
[facility] Group Notes:  (Clinical Social Work)  09/08/2015  10:00-11:00AM  Summary of Progress/Problems:   The main focus of today's process group was to   1)  discuss the importance of adding supports  2)  define health supports versus unhealthy supports  3)  identify the patient's current unhealthy supports and plan how to handle them  4)  Identify the patient's current healthy supports and plan what to add.  An emphasis was placed on using counselor, doctor, therapy groups, 12-step groups, and problem-specific support groups to expand supports.    The patient expressed full comprehension of the concepts presented, and agreed that there is a need to add more supports.  The patient stated that he does have a problem with having his own money, is almost obsessive about the desire to make more money and count it over and over.  He then has a "bad day, and f--- it all, there goes the money."  He talked about opening a bank account, which he has never done before, and asked a lot of questions about whether bills can be paid directly out of that account.  He believes that he could benefit from support groups upon discharge, as well [facility]  Type of Therapy:  Process Group with Motivational Interviewing  Participation Level:  Active  Participation Quality:  Attentive and Sharing  Affect:  Blunted  Cognitive:  Appropriate  Insight:  Developing/Improving  Engagement in Therapy:  Engaged  Modes of Intervention:   Education, Support and Processing, Activity  Ambrose MantleMareida Grossman-Orr, LCSW 09/08/2015

## 2015-09-09 MED ORDER — DULOXETINE HCL 30 MG PO CPEP: 30.0000 mg | ORAL_CAPSULE | Freq: Every day | ORAL | Status: AC

## 2015-09-09 MED ORDER — HYDROXYZINE HCL 25 MG PO TABS: 25.0000 mg | ORAL_TABLET | ORAL | Status: AC | PRN

## 2015-09-09 MED ORDER — DULOXETINE HCL 30 MG PO CPEP
30.0000 mg | ORAL_CAPSULE | Freq: Every day | ORAL | Status: DC
  Filled 2015-09-09: qty 1
  Filled 2015-09-09: qty 7

## 2015-09-09 MED ORDER — NICOTINE POLACRILEX 2 MG MT GUM: 2.0000 mg | CHEWING_GUM | OROMUCOSAL | Status: AC | PRN

## 2015-09-09 MED ORDER — GABAPENTIN 100 MG PO CAPS: 100.0000 mg | ORAL_CAPSULE | Freq: Three times a day (TID) | ORAL | Status: AC

## 2015-09-09 MED ORDER — TRAZODONE HCL 100 MG PO TABS: 100.0000 mg | ORAL_TABLET | Freq: Every evening | ORAL | Status: AC | PRN

## 2015-09-09 NOTE — [facility] Suicide Risk Assessment (Signed)
[facility] Discharge Suicide Risk Assessment   Demographic Factors:  Male and Caucasian  Total Time spent with patient: 20 minutes  Musculoskeletal: Strength & Muscle Tone: within normal limits Gait & Station: normal Patient leans: normal  Psychiatric Specialty Exam: Physical Exam  Review of Systems  Constitutional: Negative.   HENT: Negative.   Eyes: Negative.   Respiratory: Negative.   Cardiovascular: Negative.   Gastrointestinal: Negative.   Genitourinary: Negative.   Musculoskeletal: Positive for joint pain.  Skin: Negative.   Neurological: Negative.   Endo/Heme/Allergies: Negative.   Psychiatric/Behavioral: Positive for substance abuse.    Blood pressure 110/55, pulse 92, temperature 98 F (36.7 C), temperature source Oral, resp. rate 18, height 5\' 11"  (1.803 m), weight 68.493 kg (151 lb), SpO2 100 %.Body mass index is 21.07 kg/(m^2).  General Appearance: Fairly Groomed  Patent attorney::  Minimal  Speech:  Clear and Coherent409  Volume:  Decreased  Mood:  Anxious and worried  Affect:  Restricted  Thought Process:  Coherent and Goal Directed  Orientation:  Full (Time, Place, and Person)  Thought Content:  symptoms events worries concerns  Suicidal Thoughts:  No  Homicidal Thoughts:  No  Memory:  Immediate;   Fair Recent;   Fair Remote;   Fair  Judgement:  Fair  Insight:  Present and Shallow  Psychomotor Activity:  Normal  Concentration:  Fair  Recall:  [employer] of Knowledge:Fair  Language: Fair  Akathisia:  No  Handed:  Right  AIMS (if indicated):     Assets:  Desire for Improvement Housing Social Support  Sleep:  Number of Hours: 6.5  Cognition: WNL  ADL's:  Intact   Have you used any form of tobacco in the last 30 days? (Cigarettes, Smokeless Tobacco, Cigars, and/or Pipes): Yes  Has this patient used any form of tobacco in the last 30 days? (Cigarettes, Smokeless Tobacco, Cigars, and/or Pipes) Yes, A prescription for an FDA-approved tobacco cessation  medication was offered at discharge and the patient refused  Mental Status Per Nursing Assessment::   On Admission:     Current Mental Status by Physician: In full contact with reality. There are no active SI plans or intent. There are no active S/S of withdrawal. He is planning to check with a Suboxone clinic as says his sister was on it for 2 years was able to come off and she is doing really well. He is going to go home and continue to check on [facility]. He is going to give the Cymbalta and the Neurontin a good try   Loss Factors: Decline in physical health  Historical Factors: NA  Risk Reduction Factors:   Sense of responsibility to family, Employed, Living with another person, especially a relative and Positive social support  Continued Clinical Symptoms:  Depression:   Comorbid alcohol abuse/dependence Alcohol/Substance Abuse/Dependencies  Cognitive Features That Contribute To Risk:  Closed-mindedness, Polarized thinking and Thought constriction (tunnel vision)    Suicide Risk:  Minimal: No identifiable suicidal ideation.  Patients presenting with no risk factors but with morbid ruminations; may be classified as minimal risk based on the severity of the depressive symptoms  Principal Problem: Severe recurrent major depression without psychotic features [facility]) Discharge Diagnoses:  Patient Active Problem List   Diagnosis Date Noted  . Opioid type dependence, continuous (HCC) [F11.20] 09/06/2015  . Severe recurrent major depression without psychotic features (HCC) [F33.2] 09/06/2015  . GAD (generalized anxiety disorder) [F41.1] 09/06/2015  . Substance abuse [F19.10] 09/05/2015    Follow-up Information  [facility].   Why:  Office closed due to holiday. Social worker will contact you after discharge with hospital follow up appointment information.   Contact information:   [address] [place], [place] [address] Phone: [phone]       Follow up with [facility].    Why:  Referral sent on 09/09/15. Please call Shayla every few days to inquire about status of referral.   Contact information:   [address] [place] [phone]      Plan Of Care/Follow-up recommendations:  Activity:  as tolerated Diet:  regular Follow up as above, he is going to continue checking on [facility] for a rehab bed Is patient on multiple antipsychotic therapies at discharge:  No   Has Patient had three or more failed trials of antipsychotic monotherapy by history:  No  Recommended Plan for Multiple Antipsychotic Therapies: NA    Kaeleen Odom A 09/09/2015, 11:33 AM

## 2015-09-09 NOTE — Progress Notes (Signed)
D:  Patient's self inventory sheet, patient has fair sleep, sleep medication is helpful.  Fair appetite, normal energy level, good concentration.  Rated depression 2, denied hopeless, anxiety 7.  Denied withdrawals.  Denied SI.  Physical problems, pain, right knee/right hand, worst pain #7 in past 24 hours.  Pain medication is helpful.  Goal is to discharge.  Plans to talk to MD.  Does have discharge plans. A:  Medications administered per MD orders.  Emotional support and encouragement given patient. R:  Denied SI and HI, contracts for safety.  Denied A/V hallucinations.  Safety maintained with 15 minute checks.

## 2015-09-09 NOTE — Progress Notes (Signed)
Recreation Therapy Notes  Date: 01.16.2017 Time: 9:30am Location: [address] Group Room  Group Topic: Stress Management  Goal Area(s) Addresses:  Patient will actively participate in stress management techniques presented during session.   Behavioral Response: Did not attend.   Chloe Flis L Charmeka [place], LRT/CTRS        Kenda Kloehn L 09/09/2015 11:54 AM 

## 2015-09-09 NOTE — Progress Notes (Signed)
Discharge Note:  Patient discharged home with family member.  Denied SI and Hi.  Denied A/V hallucinations.  Patient stated he received all his belongings, clothing, toiletries, misc items, shoe laces, cigarillos, belt, money, etc.  Suicide prevention information given and discussed with patient who stated he understands and has no questions.  Patient stated he [facility] all [facility].

## 2015-09-09 NOTE — Discharge Summary (Signed)
Physician Discharge Summary Note  Patient:  Alfred DuckJason Perez is an 30 y.o., male MRN:  [id number] DOB:  [birth year] Patient phone:  [phone] (home)  Patient address:   [address] [place] [address],  Total Time spent with patient: 30 minutes  Date of Admission:  09/05/2015 Date of Discharge: 09/09/2015  Reason for Admission:    Principal Problem: Severe recurrent major depression without psychotic features [facility](HCC) Discharge Diagnoses: Patient Active Problem List   Diagnosis Date Noted  . Opioid type dependence, continuous (HCC) [F11.20] 09/06/2015  . Severe recurrent major depression without psychotic features (HCC) [F33.2] 09/06/2015  . GAD (generalized anxiety disorder) [F41.1] 09/06/2015  . Substance abuse [F19.10] 09/05/2015    Past Psychiatric History: SEE ABOVE  Past Medical History:  Past Medical History  Diagnosis Date  . Polysubstance abuse     Past Surgical History  Procedure Laterality Date  . Hernia repair     Family History: History reviewed. No pertinent family history. Family Psychiatric  History: SEE ABOVE Social History:  History  Alcohol Use No     History  Drug Use  . Yes  . Special: Methamphetamines, Marijuana, Cocaine    Comment: opiates    Social History   Social History  . Marital Status: Single    Spouse Name: N/A  . Number of Children: N/A  . Years of Education: N/A   Social History Main Topics  . Smoking status: Current Every Day Smoker -- 1.00 packs/day  . Smokeless tobacco: None  . Alcohol Use: No  . Drug Use: Yes    Special: Methamphetamines, Marijuana, Cocaine     Comment: opiates  . Sexual Activity: Not Asked   Other Topics Concern  . None   Social History Narrative    Hospital Course:  Alfred DuckJason Perez was admitted for Severe recurrent major depression without psychotic features (HCC) , with psychosis and crisis management.  Pt was treated discharged with the medications listed below under Medication List.   Medical problems were identified and treated as needed.  Home medications were restarted as appropriate.  Improvement was monitored by observation and Alfred DuckJason Perez 's daily report of symptom reduction.  Emotional and mental status was monitored by daily self-inventory reports completed by Alfred DuckJason Perez and clinical staff.         Alfred DuckJason Perez was evaluated by the treatment team for stability and plans for continued recovery upon discharge. Alfred DuckJason Perez 's motivation was an integral factor for scheduling further treatment. Employment, transportation, bed availability, health status, family support, and any pending legal issues were also considered during hospital stay. Pt was offered further treatment options upon discharge including but not limited to Residential, Intensive Outpatient, and Outpatient treatment.  Alfred DuckJason Perez will follow up with the services as listed below under Follow Up Information.     Upon completion of this admission the patient was both mentally and medically stable for discharge denying suicidal/homicidal ideation, auditory/visual/tactile hallucinations, delusional thoughts and paranoia.     Alfred DuckJason Perez responded well to treatment with Cymbalta 30mg  and Neurontin 100 mg PO TID   and without adverse effects. Pt demonstrated improvement without reported or observed adverse effects to the point of stability appropriate for outpatient management. Pertinent labs include. Reviewed CBC, CMP, BAL, and UDS postive for Cocaine; all unremarkable aside from noted exceptions.  Physical Findings: AIMS: Facial and Oral Movements Muscles of Facial Expression: None, normal Lips and Perioral Area: None, normal Jaw: None, normal Tongue: None, normal,Extremity Movements Upper (arms, wrists,  hands, fingers): None, normal Lower (legs, knees, ankles, toes): None, normal, Trunk Movements Neck, shoulders, hips: None, normal, Overall Severity Severity of abnormal movements (highest score from questions  above): None, normal Incapacitation due to abnormal movements: None, normal Patient's awareness of abnormal movements (rate only patient's report): No Awareness, Dental Status Current problems with teeth and/or dentures?: No Does patient usually wear dentures?: No  CIWA:  CIWA-Ar Total: 1 COWS:  COWS Total Score: 2  Musculoskeletal: Strength & Muscle Tone: within normal limits Gait & Station: normal Patient leans: N/A  Psychiatric Specialty Exam: SEE SRA BY MD Review of Systems  Psychiatric/Behavioral: Negative for suicidal ideas and hallucinations. Depression: stable. Substance abuse: stable. Nervous/anxious: stable.   All other systems reviewed and are negative.   Blood pressure 110/55, pulse 92, temperature 98 F (36.7 C), temperature source Oral, resp. rate 18, height 5\' 11"  (1.803 m), weight 68.493 kg (151 lb), SpO2 100 %.Body mass index is 21.07 kg/(m^2).  Have you used any form of tobacco in the last 30 days? (Cigarettes, Smokeless Tobacco, Cigars, and/or Pipes): Yes  Has this patient used any form of tobacco in the last 30 days? (Cigarettes, Smokeless Tobacco, Cigars, and/or Pipes) Yes, Yes, A prescription for an FDA-approved tobacco cessation medication was offered at discharge and the patient refused  Metabolic Disorder Labs:  No results found for: HGBA1C, MPG No results found for: PROLACTIN No results found for: CHOL, TRIG, HDL, CHOLHDL, VLDL, LDLCALC  See Psychiatric Specialty Exam and Suicide Risk Assessment completed by Attending Physician prior to discharge.  Discharge destination:  Home  Is patient on multiple antipsychotic therapies at discharge:  No   Has Patient had three or more failed trials of antipsychotic monotherapy by history:  No  Recommended Plan for Multiple Antipsychotic Therapies: NA  Discharge Instructions    Activity as tolerated - No restrictions    Complete by:  As directed      Diet general    Complete by:  As directed      Discharge  instructions    Complete by:  As directed   Alfred Perez has been instructed to take medications as prescribed; and report adverse effects to outpatient provider.  Follow up with primary doctor for any medical issues and If symptoms recur report to nearest emergency or crisis hot line.            Medication List    STOP taking these medications        methocarbamol 500 MG tablet  Commonly known as:  ROBAXIN     naproxen 500 MG tablet  Commonly known as:  NAPROSYN     oxyCODONE-acetaminophen 5-325 MG tablet  Commonly known as:  PERCOCET/ROXICET      TAKE these medications      Indication   DULoxetine 30 MG capsule  Commonly known as:  CYMBALTA  Take 1 capsule (30 mg total) by mouth daily.  Start taking on:  09/10/2015   Indication:  mood stabilization     gabapentin 100 MG capsule  Commonly known as:  NEURONTIN  Take 1 capsule (100 mg total) by mouth 3 (three) times daily.   Indication:  mood stabilization     hydrOXYzine 25 MG tablet  Commonly known as:  ATARAX/VISTARIL  Take 1 tablet (25 mg total) by mouth every 4 (four) hours as needed for anxiety.   Indication:  Anxiety Neurosis     nicotine polacrilex 2 MG gum  Commonly known as:  NICORETTE  Take 1 each (2  mg total) by mouth as needed for smoking cessation.   Indication:  Nicotine Addiction     traZODone 100 MG tablet  Commonly known as:  DESYREL  Take 1 tablet (100 mg total) by mouth at bedtime as needed for sleep (May repeat x1).   Indication:  Trouble Sleeping           Follow-up Information    Follow up with [facility].   Why:  Office closed due to holiday. Social worker will contact you after discharge with hospital follow up appointment information.   Contact information:   [address] [place], [place] [address] Phone: [phone]       Follow up with [facility].   Why:  Referral sent on 09/09/15. Please call Shayla every few days to inquire about status of referral.   Contact information:   [address] [place] [phone]      Follow-up recommendations:  Activity:  as tolerated Diet:  heart healthy  Comments:  Take all medications as prescribed. Keep all follow-up appointments as scheduled.  Do not consume alcohol or use illegal drugs while on prescription medications. Report any adverse effects from your medications to your primary care provider promptly.  In the event of recurrent symptoms or worsening symptoms, call 911, a crisis hotline, or go to the nearest emergency department for evaluation.   Signed: Oneta Rack FNP- [facility] 09/09/2015, 12:02 PM I personally assessed the patient and formulated the plan Madie Reno A. Dub Mikes, M.D.

## 2015-09-09 NOTE — [facility] Group Notes (Signed)
   [facility] Note  09/09/2015  8:45 AM   Participation Quality: Alert, Appropriate and Oriented  Mood/Affect: Appropriate  Depression Rating: 0  Anxiety Rating: reports low anxiety level today  Thoughts of Suicide: Pt denies SI/HI  Will you contract for safety? Yes  Current AVH: Pt denies  Plan for Discharge/Comments: Pt attended discharge planning group and actively participated in group. CSW provided pt with today's workbook. Patient [place] to [place] up with outpatient services.  Transportation Means: Pt reports access to transportation  Supports: No supports mentioned at this time  Samuella BruinKristin Gabriel Conry, MSW, [employer]CSWA Clinical Social Worker [facility] [phone]

## 2015-09-09 NOTE — Progress Notes (Signed)
  [facility] :  Will you be returning to the same living situation after discharge:  Yes,  plans to return home with mother At discharge, do you have transportation home?: Yes,  by family Do you have the ability to pay for your medications: Yes,  patient will be provided with prescriptions at discharge  Release of information consent forms completed and in the chart;  Patient's signature needed at discharge.  Patient to Follow up at: Follow-up Information    [facility].   Why:  Office closed due to holiday. Social worker will contact you after discharge with hospital follow up appointment information.   Contact information:   [address] [place], [place] [address] Phone: [phone]       Follow up with [facility].   Why:  Referral sent on 09/09/15. Please call Shayla every few days to inquire about status of referral.   Contact information:   [address] [place] [phone]      Next level of care provider has access to [facility] Link:no  Safety Planning and Suicide Prevention discussed: Yes,  with patient and mother  Have you used any form of tobacco in the last 30 days? (Cigarettes, Smokeless Tobacco, Cigars, and/or Pipes): Yes  Has patient been referred to the Quitline?: Yes, faxed on 09/09/15  Patient has been referred for addiction treatment: Yes  Darby Fleeman, [place]ristin L 09/09/2015, 11:04 AM

## 2015-09-09 NOTE — BHH Suicide Risk Assessment (Signed)
[facility] INPATIENT:  Family/Significant Other Suicide Prevention Education  Suicide Prevention Education:  Education Completed; mother Jorene MinorsConnie Stolar [phone],  (name of family member/significant other) has been identified by the patient as the family member/significant other with whom the patient will be residing, and identified as the person(s) who will aid the patient in the event of a mental health crisis (suicidal ideations/suicide attempt).  With written consent from the patient, the family member/significant other has been provided the following suicide prevention education, prior to the and/or following the discharge of the patient.  The suicide prevention education provided includes the following:  Suicide risk factors  Suicide prevention and interventions  National Suicide Hotline telephone number  [facility] assessment telephone number  [facility] Emergency Assistance 911  [facility] and/or Residential Mobile Crisis Unit telephone number  Request made of family/significant other to:  Remove weapons (e.g., guns, rifles, knives), all items previously/currently identified as safety concern.    Remove drugs/medications (over-the-counter, prescriptions, illicit drugs), all items previously/currently identified as a safety concern.  The family member/significant other verbalizes understanding of the suicide prevention education information provided.  The family member/significant other agrees to remove the items of safety concern listed above.  Carolyn Maniscalco, [place]ristin L 09/09/2015, 10:27 AM

## 2021-10-05 ENCOUNTER — Other Ambulatory Visit: Payer: Self-pay

## 2021-10-05 ENCOUNTER — Emergency Department (HOSPITAL_COMMUNITY)
Admission: EM | Admit: 2021-10-05 | Discharge: 2021-10-05 | Disposition: A | Discharge status: Home / Self Care | Payer: Self-pay | Attending: Emergency Medicine | Admitting: Emergency Medicine

## 2021-10-05 ENCOUNTER — Encounter (HOSPITAL_COMMUNITY): Payer: Self-pay

## 2021-10-05 DIAGNOSIS — F419 Anxiety disorder, unspecified: Secondary | ICD-10-CM | POA: Insufficient documentation

## 2021-10-05 DIAGNOSIS — F1990 Other psychoactive substance use, unspecified, uncomplicated: Secondary | ICD-10-CM

## 2021-10-05 DIAGNOSIS — M79602 Pain in left arm: Secondary | ICD-10-CM

## 2021-10-05 DIAGNOSIS — R202 Paresthesia of skin: Secondary | ICD-10-CM | POA: Insufficient documentation

## 2021-10-05 DIAGNOSIS — R200 Anesthesia of skin: Secondary | ICD-10-CM | POA: Insufficient documentation

## 2021-10-05 NOTE — ED Provider Notes (Signed)
[facility] EMERGENCY DEPARTMENT Provider Note   CSN: [id number] Arrival date & time: 10/05/21  1638     History  Chief Complaint  Patient presents with   Arm Pain    Alfred Perez is a 36 y.o. male.   Arm Pain Pertinent negatives include no chest pain, no headaches and no shortness of breath.      Alfred Perez is a 36 y.o. male with past medical history of generalized anxiety disorder, substance abuse who presents to the Emergency Department complaining of pain of his left arm with numbness and tingling of his fingers and pain along his bicep area.  Symptoms began 4 to 5 hours ago after injecting crystal methamphetamine.  He states that he has been injecting "ice" for 2 weeks.  He has been through a detox program at [facility].  He reports that he was doing well taking Subutex daily until 2 weeks ago.  Denies any SI or HI.  He also denies any current chest pain, shortness of breath, redness of his arm, bleeding or swelling.   Home Medications Prior to Admission medications   Medication Sig Start Date End Date Taking? Authorizing Provider  DULoxetine (CYMBALTA) 30 MG capsule Take 1 capsule (30 mg total) by mouth daily. 09/10/15   Oneta Rack, NP  gabapentin (NEURONTIN) 100 MG capsule Take 1 capsule (100 mg total) by mouth 3 (three) times daily. 09/09/15   Oneta Rack, NP  hydrOXYzine (ATARAX/VISTARIL) 25 MG tablet Take 1 tablet (25 mg total) by mouth every 4 (four) hours as needed for anxiety. 09/09/15   Oneta Rack, NP  nicotine polacrilex (NICORETTE) 2 MG gum Take 1 each (2 mg total) by mouth as needed for smoking cessation. 09/09/15   Oneta Rack, NP  traZODone (DESYREL) 100 MG tablet Take 1 tablet (100 mg total) by mouth at bedtime as needed for sleep (May repeat x1). 09/09/15   Oneta Rack, NP      Allergies    Penicillins    Review of Systems   Review of Systems  Constitutional:  Negative for chills and fever.  Respiratory:  Negative for shortness of breath.    Cardiovascular:  Negative for chest pain.  Gastrointestinal:  Negative for nausea and vomiting.  Musculoskeletal:  Positive for myalgias (left arm pain, numbness and tingling). Negative for arthralgias.  Neurological:  Negative for dizziness, syncope and headaches.  All other systems reviewed and are negative.  Physical Exam Updated Vital Signs BP 118/72 (BP Location: Right Leg)    Pulse 96    Temp 97.9 F (36.6 C) (Oral)    Resp (!) 21    Ht 6' (1.829 m)    Wt 71.3 kg    SpO2 99%    BMI 21.32 kg/m  Physical Exam Vitals and nursing note reviewed.  Constitutional:      Appearance: Normal appearance. He is not ill-appearing.     Comments: Patient is anxious appearing  Cardiovascular:     Rate and Rhythm: Normal rate and regular rhythm.     Pulses: Normal pulses.  Pulmonary:     Effort: Pulmonary effort is normal.  Musculoskeletal:        General: No swelling or deformity.     Cervical back: No tenderness.     Comments: Left elbow, wrist and shoulder nontender.  Patient able to perform full range of motion left arm without difficulty. Bicep tendon palpable and appears intact  Skin:    General: Skin is warm.  Capillary Refill: Capillary refill takes less than 2 seconds.     Comments: Needle marks present at the left antecubital fossa.  No bleeding, edema, erythema, excessive warmth or induration.  Neurological:     General: No focal deficit present.     Mental Status: He is alert.     Sensory: No sensory deficit.     Motor: No weakness.  Psychiatric:        Mood and Affect: Mood is anxious.        Speech: Speech is rapid and pressured.        Behavior: Behavior is cooperative.        Thought Content: Thought content does not include homicidal or suicidal ideation.    ED Results / Procedures / Treatments   Labs (all labs ordered are listed, but only abnormal results are displayed) Labs Reviewed - No data to display  EKG None  Radiology No results  found.  Procedures Procedures    Medications Ordered in ED Medications - No data to display  ED Course/ Medical Decision Making/ A&P                           Medical Decision Making Patient here for evaluation of left arm pain after injecting crystal methamphetamine.  Describes having a numbness tingling and swelling feeling of his antecubital fossa and into his fingers and upper arm.  Has been injecting meth for several days to weeks.  Differential diagnosis would include abscess, DVT, cellulitis, injection into a nerve.  Given lack of exam findings, I doubt infectious process at this time.  No evidence on exam for DVT  Amount and/or Complexity of Data Reviewed Independent Historian: parent   Likely nerve irritation.  No clinical findings to suggest infectious process or clot.  Neurovascularly intact.  Patient appears appropriate for discharge home.  Parent is also at bedside.  Will provide resource list for substance abuse. Denies SI or HI         Final Clinical Impression(s) / ED Diagnoses Final diagnoses:  Left arm pain  Illicit drug use    Rx / DC Orders ED Discharge Orders     None         Rosey Bath 10/05/21 1919    Eber Hong, MD 10/05/21 1946

## 2021-10-05 NOTE — Discharge Instructions (Signed)
Follow-up with [facility] for recheck.  Return emergency department for any new or worsening symptoms.

## 2021-10-05 NOTE — ED Triage Notes (Signed)
Pt shot up ICE and now he feels like he has a swollen part in his arm . Pt wants his arm assessed because he says it feels wrong.

## 2021-10-17 ENCOUNTER — Other Ambulatory Visit: Payer: Self-pay

## 2021-10-17 ENCOUNTER — Emergency Department (HOSPITAL_COMMUNITY)
Admission: EM | Admit: 2021-10-17 | Discharge: 2021-10-17 | Disposition: A | Discharge status: Home / Self Care | Payer: Self-pay | Attending: Emergency Medicine | Admitting: Emergency Medicine

## 2021-10-17 ENCOUNTER — Encounter (HOSPITAL_COMMUNITY): Payer: Self-pay

## 2021-10-17 DIAGNOSIS — F1193 Opioid use, unspecified with withdrawal: Secondary | ICD-10-CM

## 2021-10-17 DIAGNOSIS — F1123 Opioid dependence with withdrawal: Secondary | ICD-10-CM | POA: Insufficient documentation

## 2021-10-17 HISTORY — DX: Disorder of thyroid, unspecified: E07.9

## 2021-10-17 LAB — RAPID URINE DRUG SCREEN, HOSP PERFORMED
Amphetamines: NOT DETECTED
Barbiturates: NOT DETECTED
Benzodiazepines: NOT DETECTED
Cocaine: NOT DETECTED
Opiates: NOT DETECTED
Tetrahydrocannabinol: NOT DETECTED

## 2021-10-17 LAB — I-STAT CHEM 8, ED
BUN: 10 mg/dL (ref 6–20)
Calcium, Ion: 1.33 mmol/L (ref 1.15–1.40)
Chloride: 107 mmol/L (ref 98–111)
Creatinine, Ser: 0.6 mg/dL — ABNORMAL LOW (ref 0.61–1.24)
Glucose, Bld: 95 mg/dL (ref 70–99)
HCT: 42 % (ref 39.0–52.0)
Hemoglobin: 14.3 g/dL (ref 13.0–17.0)
Potassium: 4.1 mmol/L (ref 3.5–5.1)
Sodium: 142 mmol/L (ref 135–145)
TCO2: 25 mmol/L (ref 22–32)

## 2021-10-17 MED ORDER — LORAZEPAM 1 MG PO TABS
1.0000 mg | ORAL_TABLET | Freq: Once | ORAL | Status: AC
  Administered 2021-10-17: 1 mg via ORAL
  Filled 2021-10-17: qty 1

## 2021-10-17 NOTE — ED Provider Notes (Signed)
[facility] DEPT Provider Note   CSN: [id number] Arrival date & time: 10/17/21  [id number]     History  Chief Complaint  Patient presents with   Abdominal Pain   detox    Alfred Perez is a 36 y.o. male.  HPI 36 year old male presents today stating that he is having diffuse muscle cramps, stomach ache, nausea with 1 episode of vomiting, and has been off of his Subutex since Saturday.  He states that he is in a program that requires complete abstinence.  States he has not been sleeping.  He has been doing some working out.  He has been taking things by mouth and had a bowel movement that was normal yesterday.  He has a history of opiate abuse states he was placed on the Subutex for that.  However he then began using methamphetamines approximately a month ago for couple weeks.  He is now in a new program and states he has not used anything with the last dose of Subutex on Saturday but does not have  He denies alcohol use, chest pain, dyspnea, fever, chills, skin lesions.  He was seen for some left arm pain several weeks ago after he had shot up in left arm.  There are no further problems with the arm at this time.    Home Medications Prior to Admission medications   Medication Sig Start Date End Date Taking? Authorizing Provider  DULoxetine (CYMBALTA) 30 MG capsule Take 1 capsule (30 mg total) by mouth daily. 09/10/15   Derrill Center, NP  gabapentin (NEURONTIN) 100 MG capsule Take 1 capsule (100 mg total) by mouth 3 (three) times daily. 09/09/15   Derrill Center, NP  hydrOXYzine (ATARAX/VISTARIL) 25 MG tablet Take 1 tablet (25 mg total) by mouth every 4 (four) hours as needed for anxiety. 09/09/15   Derrill Center, NP  nicotine polacrilex (NICORETTE) 2 MG gum Take 1 each (2 mg total) by mouth as needed for smoking cessation. 09/09/15   Derrill Center, NP  traZODone (DESYREL) 100 MG tablet Take 1 tablet (100 mg total) by mouth at bedtime as needed for sleep (May  repeat x1). 09/09/15   Derrill Center, NP      Allergies    Penicillins    Review of Systems   Review of Systems  Constitutional:  Positive for appetite change. Negative for fever.  HENT: Negative.    Eyes: Negative.   Respiratory: Negative.    Cardiovascular: Negative.   Gastrointestinal:  Positive for abdominal pain.  Genitourinary: Negative.   Musculoskeletal:  Positive for arthralgias.  Skin: Negative.   Hematological: Negative.    Physical Exam Updated Vital Signs BP 109/82    Pulse 71    Temp 98.2 F (36.8 C) (Oral)    Resp 17    Ht 1.829 m (6')    Wt 73.5 kg    SpO2 100%    BMI 21.97 kg/m  Physical Exam Vitals and nursing note reviewed.  Constitutional:      Appearance: He is well-developed.     Comments: Patient appears somewhat jittery  HENT:     Head: Normocephalic and atraumatic.     Mouth/Throat:     Mouth: Mucous membranes are moist.     Pharynx: Oropharynx is clear.  Eyes:     Extraocular Movements: Extraocular movements intact.  Cardiovascular:     Rate and Rhythm: Normal rate and regular rhythm.  Pulmonary:     Effort: Pulmonary effort is  normal.     Breath sounds: Normal breath sounds.  Abdominal:     General: Abdomen is flat. Bowel sounds are normal.     Palpations: Abdomen is soft.     Tenderness: There is no abdominal tenderness.  Skin:    General: Skin is warm and dry.     Capillary Refill: Capillary refill takes less than 2 seconds.  Neurological:     General: No focal deficit present.     Mental Status: He is alert.  Psychiatric:        Mood and Affect: Mood is anxious.    ED Results / Procedures / Treatments   Labs (all labs ordered are listed, but only abnormal results are displayed) Labs Reviewed  I-STAT CHEM 8, ED - Abnormal; Notable for the following components:      Result Value   Creatinine, Ser 0.60 (*)    All other components within normal limits  RAPID URINE DRUG SCREEN, HOSP PERFORMED    EKG None  Radiology No  results found.  Procedures Procedures    Medications Ordered in ED Medications  LORazepam (ATIVAN) tablet 1 mg (1 mg Oral Given 10/17/21 1018)    ED Course/ Medical Decision Making/ A&P Clinical Course as of 10/17/21 1136  Fri Oct 17, 2021  1135 I-STAT performed, reviewed, and interpreted with normal electrolytes, normal creatinine, and normal hemoglobin. [DR]    Clinical Course User Index [DR] Pattricia Boss, MD                           Medical Decision Making 36 year old male history of substance abuse who has been off of his Subutex for several days.  He is in a new program that requires abstinence.  He appears to be going through some withdrawal.  He is uncomfortable with poor sleep.  However he is orally hydrating and is not having active diarrhea.  His vital signs are within normal limits.  Labs tested and electrolytes, creatinine, hemoglobin normal. Patient was given 1 mg of Ativan here.  He appears to be stable for discharge.  Amount and/or Complexity of Data Reviewed Labs: ordered.  Risk Prescription drug management.           Final Clinical Impression(s) / ED Diagnoses Final diagnoses:  Opiate withdrawal [facility])    Rx / DC Orders ED Discharge Orders     None         Pattricia Boss, MD 10/17/21 1136

## 2021-10-17 NOTE — ED Triage Notes (Signed)
Patient states he was at [facility] and states he Subotex and states his last dose was 6 days ago. Patient c/o abdominal pain and muscle spasms. Patient states he needs blood work completed and states he has not been asleep in 5 days. Patient states he needs something to help him with detox.

## 2021-10-17 NOTE — Discharge Instructions (Addendum)
Your labs today are normal with normal renal function Please continue to drink plenty of fluids Symptoms should gradually decrease. Return if you are having worsening symptoms especially chest pain, dyspnea, or fever.
